# Patient Record
Sex: Female | Born: 1985 | Race: Black or African American | Hispanic: No | Marital: Single | Smoking: Never smoker
Health system: Southern US, Community
[De-identification: ages and names within clinical notes are randomized; demographics above are authoritative.]

---

## 2012-06-20 ENCOUNTER — Encounter (HOSPITAL_COMMUNITY): Payer: Self-pay | Admitting: Emergency Medicine

## 2012-06-20 ENCOUNTER — Emergency Department (HOSPITAL_COMMUNITY)
Admission: EM | Admit: 2012-06-20 | Discharge: 2012-06-21 | Disposition: A | Discharge status: Home / Self Care | Payer: Self-pay | Attending: Emergency Medicine | Admitting: Emergency Medicine

## 2012-06-20 ENCOUNTER — Emergency Department (HOSPITAL_COMMUNITY): Payer: Self-pay

## 2012-06-20 DIAGNOSIS — J45901 Unspecified asthma with (acute) exacerbation: Secondary | ICD-10-CM | POA: Insufficient documentation

## 2012-06-20 DIAGNOSIS — Z79899 Other long term (current) drug therapy: Secondary | ICD-10-CM | POA: Insufficient documentation

## 2012-06-20 MED ORDER — IPRATROPIUM BROMIDE 0.02 % IN SOLN
0.5000 mg | Freq: Once | RESPIRATORY_TRACT | Status: AC
  Administered 2012-06-20: 0.5 mg via RESPIRATORY_TRACT
  Filled 2012-06-20: qty 2.5

## 2012-06-20 MED ORDER — ALBUTEROL SULFATE (5 MG/ML) 0.5% IN NEBU
5.0000 mg | INHALATION_SOLUTION | Freq: Once | RESPIRATORY_TRACT | Status: AC
  Administered 2012-06-20: 5 mg via RESPIRATORY_TRACT
  Filled 2012-06-20: qty 1

## 2012-06-20 MED ORDER — PREDNISONE 20 MG PO TABS
60.0000 mg | ORAL_TABLET | Freq: Once | ORAL | Status: AC
  Administered 2012-06-20: 60 mg via ORAL
  Filled 2012-06-20: qty 3

## 2012-06-20 NOTE — ED Notes (Signed)
Pt states she was laying down tonight and started having difficulty breathing   Pt states she got up and went in the kitchen and sat up for a bit but it continued  Pt states when she was younger she had an inhaler but has not had one in a long time  Pt states this is happening more frequently and she has noticed it happens with weather changes

## 2012-06-21 MED ORDER — PREDNISONE 10 MG PO TABS: 10.0000 mg | ORAL_TABLET | Freq: Every day | ORAL | Status: DC

## 2012-06-21 MED ORDER — ALBUTEROL SULFATE HFA 108 (90 BASE) MCG/ACT IN AERS
2.0000 | INHALATION_SPRAY | RESPIRATORY_TRACT | Status: DC | PRN
  Administered 2012-06-21: 2 via RESPIRATORY_TRACT
  Filled 2012-06-21: qty 6.7

## 2012-06-21 MED ORDER — ALBUTEROL SULFATE HFA 108 (90 BASE) MCG/ACT IN AERS: 1.0000 | INHALATION_SPRAY | Freq: Four times a day (QID) | RESPIRATORY_TRACT | Status: DC | PRN

## 2012-06-21 NOTE — ED Notes (Signed)
PA @ bedside.

## 2012-06-21 NOTE — ED Provider Notes (Signed)
Medical screening examination/treatment/procedure(s) were performed by non-physician practitioner and as supervising physician I was immediately available for consultation/collaboration.  Olivia Mackie, MD 06/21/12 [phone]

## 2012-06-21 NOTE — ED Provider Notes (Signed)
History     CSN: [id number]  Arrival date & time 06/20/12  2307   First MD Initiated Contact with Patient 06/20/12 2333      Chief Complaint  Patient presents with  . Shortness of Breath    (Consider location/radiation/quality/duration/timing/severity/associated sxs/prior treatment) HPI  Pt presents to the ED with complaints of wheezing and SOB. She has a long history of asthma for which she normally uses an albuterol inhaler but has been out of it for years. She noticed that this has been happening more frequently over the past year when the weather turns cold. She endorses having a cough and coughing up some clear mucus. No body aches, nasal congestion, rhinorrhea, shortness of breath, headache, sinus pressure, CP, postnasal drip, neck pain, sore throat, sneezing, chills.  Denies sick contacts at home. She tried drinking coffee and resting but it did not help her symptoms. nad vss  History reviewed. No pertinent past medical history.  History reviewed. No pertinent past surgical history.  Family History  Problem Relation Age of Onset  . Adopted: Yes    History  Substance Use Topics  . Smoking status: Never Smoker   . Smokeless tobacco: Not on file  . Alcohol Use: Yes     Comment: socially    OB History   Grav Para Term Preterm Abortions TAB SAB Ect Mult Living                  Review of Systems  Review of Systems  Gen: no weight loss, fevers, chills, night sweats  Eyes: no discharge or drainage, no occular pain or visual changes  Nose: no epistaxis or rhinorrhea  Mouth: no dental pain, no sore throat  Neck: no neck pain  Lungs:+ wheezing, coughing and SOB NOhemoptysis CV: no chest pain, palpitations, dependent edema or orthopnea  Abd: no abdominal pain, nausea, vomiting  GU: no dysuria or gross hematuria  MSK:  No abnormalities  Neuro: no headache, no focal neurologic deficits  Skin: no abnormalities Psyche: negative.   Allergies  Bee venom  Home  Medications   Current Outpatient Rx  Name  Route  Sig  Dispense  Refill  . albuterol (PROVENTIL HFA;VENTOLIN HFA) 108 (90 BASE) MCG/ACT inhaler   Inhalation   Inhale 1-2 puffs into the lungs every 6 (six) hours as needed for wheezing.   1 Inhaler   0   . predniSONE (DELTASONE) 10 MG tablet   Oral   Take 1 tablet (10 mg total) by mouth daily.   21 tablet   0     6 tabs on day 1, 5 tabs on day 2, 4 tabs on day 3, ...     BP 136/58  Pulse 77  Temp(Src) 98.5 F (36.9 C) (Oral)  Resp 20  SpO2 98%  LMP 05/27/2012  Physical Exam  Nursing note and vitals reviewed. Constitutional: She appears well-developed and well-nourished. No distress.  HENT:  Head: Normocephalic and atraumatic.  Eyes: Pupils are equal, round, and reactive to light.  Neck: Normal range of motion. Neck supple.  Cardiovascular: Normal rate and regular rhythm.   Pulmonary/Chest: Effort normal. No respiratory distress. She has wheezes (diffuse moderate wheezing on exam). She has no rales. She exhibits no tenderness.  Abdominal: Soft.  Neurological: She is alert.  Skin: Skin is warm and dry.    ED Course  Procedures (including critical care time)  Labs Reviewed - No data to display Dg Chest 2 View  06/21/2012  *RADIOLOGY REPORT*  Clinical Data: Wheezing and cough.  Shortness of breath.  History of asthma.  CHEST - 2 VIEW  Comparison: None.  Findings: Mild hyperinflation. The heart size and pulmonary vascularity are normal. The lungs appear clear and expanded without focal air space disease or consolidation. No blunting of the costophrenic angles.  No pneumothorax.  Mediastinal contours appear intact.  IMPRESSION: No evidence of active pulmonary disease.   Original Report Authenticated By: Burman Nieves, M.D.      1. Asthma exacerbation       MDM  Patient given 60 mg by mouth prednisone and an albuterol and Atrovent nebulized treatment. After the treatment she says that she feels much better and no  longer feels short of breath. Her plain film chest x-ray shows no evidence of active pulmonary disease. On re\re auscultation her lungs are no longer hear any wheezing she is moving air very well.  Rx: albuterol and prednisone  Referral: PCP referral upon pt request  Pt has been advised of the symptoms that warrant their return to the ED. Patient has voiced understanding and has agreed to follow-up with the PCP or specialist.         Dorthula Matas, PA 06/21/12 0110

## 2013-01-21 ENCOUNTER — Emergency Department (HOSPITAL_COMMUNITY)
Admission: EM | Admit: 2013-01-21 | Discharge: 2013-01-21 | Disposition: A | Discharge status: Home / Self Care | Payer: 59 | Attending: Emergency Medicine | Admitting: Emergency Medicine

## 2013-01-21 ENCOUNTER — Encounter (HOSPITAL_COMMUNITY): Payer: Self-pay | Admitting: Emergency Medicine

## 2013-01-21 DIAGNOSIS — Z3202 Encounter for pregnancy test, result negative: Secondary | ICD-10-CM | POA: Insufficient documentation

## 2013-01-21 DIAGNOSIS — Z79899 Other long term (current) drug therapy: Secondary | ICD-10-CM | POA: Insufficient documentation

## 2013-01-21 DIAGNOSIS — B349 Viral infection, unspecified: Secondary | ICD-10-CM

## 2013-01-21 DIAGNOSIS — B9789 Other viral agents as the cause of diseases classified elsewhere: Secondary | ICD-10-CM | POA: Insufficient documentation

## 2013-01-21 DIAGNOSIS — J45909 Unspecified asthma, uncomplicated: Secondary | ICD-10-CM | POA: Insufficient documentation

## 2013-01-21 DIAGNOSIS — R5381 Other malaise: Secondary | ICD-10-CM | POA: Insufficient documentation

## 2013-01-21 DIAGNOSIS — Z792 Long term (current) use of antibiotics: Secondary | ICD-10-CM | POA: Insufficient documentation

## 2013-01-21 DIAGNOSIS — R110 Nausea: Secondary | ICD-10-CM | POA: Insufficient documentation

## 2013-01-21 DIAGNOSIS — R51 Headache: Secondary | ICD-10-CM | POA: Insufficient documentation

## 2013-01-21 LAB — CBC WITH DIFFERENTIAL/PLATELET
Basophils Absolute: 0 10*3/uL (ref 0.0–0.1)
Basophils Relative: 0 % (ref 0–1)
Eosinophils Absolute: 0 10*3/uL (ref 0.0–0.7)
Hemoglobin: 13.7 g/dL (ref 12.0–15.0)
MCH: 31.7 pg (ref 26.0–34.0)
MCHC: 34.5 g/dL (ref 30.0–36.0)
Monocytes Relative: 6 % (ref 3–12)
Neutro Abs: 11.3 10*3/uL — ABNORMAL HIGH (ref 1.7–7.7)
Neutrophils Relative %: 82 % — ABNORMAL HIGH (ref 43–77)
Platelets: 189 10*3/uL (ref 150–400)
RDW: 11.7 % (ref 11.5–15.5)

## 2013-01-21 LAB — URINALYSIS, ROUTINE W REFLEX MICROSCOPIC
Glucose, UA: NEGATIVE mg/dL
Hgb urine dipstick: NEGATIVE
Ketones, ur: 15 mg/dL — AB
Leukocytes, UA: NEGATIVE
pH: 5.5 (ref 5.0–8.0)

## 2013-01-21 LAB — COMPREHENSIVE METABOLIC PANEL
AST: 18 U/L (ref 0–37)
Albumin: 3.4 g/dL — ABNORMAL LOW (ref 3.5–5.2)
Alkaline Phosphatase: 62 U/L (ref 39–117)
BUN: 8 mg/dL (ref 6–23)
Potassium: 3.8 mEq/L (ref 3.5–5.1)
Total Protein: 7.3 g/dL (ref 6.0–8.3)

## 2013-01-21 LAB — CG4 I-STAT (LACTIC ACID): Lactic Acid, Venous: 1.03 mmol/L (ref 0.5–2.2)

## 2013-01-21 MED ORDER — SODIUM CHLORIDE 0.9 % IV BOLUS (SEPSIS)
2000.0000 mL | Freq: Once | INTRAVENOUS | Status: AC
  Administered 2013-01-21: 2000 mL via INTRAVENOUS

## 2013-01-21 MED ORDER — ACETAMINOPHEN 325 MG PO TABS
650.0000 mg | ORAL_TABLET | Freq: Four times a day (QID) | ORAL | Status: DC | PRN
  Administered 2013-01-21: 650 mg via ORAL

## 2013-01-21 MED ORDER — ONDANSETRON 8 MG PO TBDP: ORAL_TABLET | ORAL | Status: DC

## 2013-01-21 MED ORDER — SODIUM CHLORIDE 0.9 % IV BOLUS (SEPSIS): 500.0000 mL | Freq: Once | INTRAVENOUS | Status: DC

## 2013-01-21 MED ORDER — OXYCODONE-ACETAMINOPHEN 5-325 MG PO TABS: 2.0000 | ORAL_TABLET | ORAL | Status: DC | PRN

## 2013-01-21 MED ORDER — HYDROMORPHONE HCL PF 1 MG/ML IJ SOLN
1.0000 mg | Freq: Once | INTRAMUSCULAR | Status: AC
  Administered 2013-01-21: 1 mg via INTRAVENOUS
  Filled 2013-01-21: qty 1

## 2013-01-21 MED ORDER — DIPHENHYDRAMINE HCL 50 MG/ML IJ SOLN
25.0000 mg | Freq: Once | INTRAMUSCULAR | Status: AC
  Administered 2013-01-21: 25 mg via INTRAVENOUS
  Filled 2013-01-21: qty 1

## 2013-01-21 MED ORDER — METOCLOPRAMIDE HCL 5 MG/ML IJ SOLN
10.0000 mg | Freq: Once | INTRAMUSCULAR | Status: AC
  Administered 2013-01-21: 10 mg via INTRAVENOUS
  Filled 2013-01-21: qty 2

## 2013-01-21 MED ORDER — METOCLOPRAMIDE HCL 10 MG PO TABS: 10.0000 mg | ORAL_TABLET | Freq: Four times a day (QID) | ORAL | Status: DC | PRN

## 2013-01-21 NOTE — ED Provider Notes (Signed)
CSN: [id number]     Arrival date & time 01/21/13  1440 History   First MD Initiated Contact with Patient 01/21/13 1949     Chief Complaint  Patient presents with  . Fever  . Headache   (Consider location/radiation/quality/duration/timing/severity/associated sxs/prior Treatment) HPI This 27 year old female has a history of asthma, she does not have any recent travel history, she has just over 24 hours of gradual onset fever chills body aches fatigue nausea and last night started out with a very mild headache that became severe but is now improving and is no longer severe today, she has no confusion no stiff neck no rash no change in speech vision swallowing or understanding no focal or lateralizing weakness or numbness no incoordination no confusion, treatment prior to arrival consists of Tylenol and aspirin with partial improvement in symptoms, she is no shortness breath no abdominal pain no vomiting, she did have loose stools this morning improved with Imodium.  History reviewed. No pertinent past medical history. past medical history of asthma History reviewed. No pertinent past surgical history. Family History  Problem Relation Age of Onset  . Adopted: Yes   History  Substance Use Topics  . Smoking status: Never Smoker   . Smokeless tobacco: Not on file  . Alcohol Use: Yes     Comment: socially   OB History   Grav Para Term Preterm Abortions TAB SAB Ect Mult Living                 Review of Systems 10 Systems reviewed and are negative for acute change except as noted in the HPI. Allergies  Bee venom  Home Medications   Current Outpatient Rx  Name  Route  Sig  Dispense  Refill  . albuterol (PROVENTIL HFA;VENTOLIN HFA) 108 (90 BASE) MCG/ACT inhaler   Inhalation   Inhale 2 puffs into the lungs every 6 (six) hours as needed for wheezing or shortness of breath.         Marland Kitchen amoxicillin (AMOXIL) 500 MG capsule   Oral   Take 500 mg by mouth 3 (three) times daily.         Marland Kitchen  aspirin 325 MG tablet   Oral   Take 650 mg by mouth every 4 (four) hours as needed for pain or fever.         . hydrocortisone cream 0.5 %   Topical   Apply 1 application topically daily as needed (eczema).         Marland Kitchen ibuprofen (ADVIL,MOTRIN) 200 MG tablet   Oral   Take 400 mg by mouth every 6 (six) hours as needed for pain.         Marland Kitchen metoCLOPramide (REGLAN) 10 MG tablet   Oral   Take 1 tablet (10 mg total) by mouth every 6 (six) hours as needed (nausea/headache).   6 tablet   0   . ondansetron (ZOFRAN ODT) 8 MG disintegrating tablet      8mg  ODT q4 hours prn nausea   4 tablet   0   . oxyCODONE-acetaminophen (PERCOCET) 5-325 MG per tablet   Oral   Take 2 tablets by mouth every 4 (four) hours as needed for pain.   15 tablet   0    BP 109/63  Pulse 90  Temp(Src) 100.8 F (38.2 C) (Oral)  Resp 16  SpO2 98% Physical Exam  Nursing note and vitals reviewed. Constitutional:  Awake, alert, nontoxic appearance with baseline speech for patient.  HENT:  Head:  Atraumatic.  Mouth/Throat: No oropharyngeal exudate.  Oropharynx noninjected; mucous membranes somewhat dry  Eyes: EOM are normal. Pupils are equal, round, and reactive to light. Right eye exhibits no discharge. Left eye exhibits no discharge.  Neck: Neck supple.  Cardiovascular: Regular rhythm.   No murmur heard. Tachycardic  Pulmonary/Chest: Effort normal and breath sounds normal. No stridor. No respiratory distress. She has no wheezes. She has no rales. She exhibits no tenderness.  Abdominal: Soft. Bowel sounds are normal. She exhibits no mass. There is no tenderness. There is no rebound.  Musculoskeletal: She exhibits tenderness. She exhibits no edema.  Baseline ROM, moves extremities with no obvious new focal weakness. Minimal diffuse tenderness to back arms and legs.  Lymphadenopathy:    She has no cervical adenopathy.  Neurological: She is alert.  Awake, alert, cooperative and aware of situation; motor  strength bilaterally; sensation normal to light touch bilaterally; peripheral visual fields full to confrontation; no facial asymmetry; tongue midline; major cranial nerves appear intact; no pronator drift, normal finger to nose bilaterally, baseline gait without new ataxia.  Skin: No rash noted.  Psychiatric: She has a normal mood and affect.    ED Course  Procedures (including critical care time) Pt feels improved after observation and/or treatment in ED.Patient / Family / Caregiver informed of clinical course, understand medical decision-making process, and agree with plan.Pt aware only LP can r/o meningitis, but I feel Pt unlikely to have bacterial meningitis, do not feel LP necessary and Pt agrees with shared decision-making. Labs Review Labs Reviewed  CBC WITH DIFFERENTIAL - Abnormal; Notable for the following:    WBC 13.8 (*)    Neutrophils Relative % 82 (*)    Neutro Abs 11.3 (*)    All other components within normal limits  COMPREHENSIVE METABOLIC PANEL - Abnormal; Notable for the following:    Albumin 3.4 (*)    GFR calc non Af Amer 77 (*)    GFR calc Af Amer 89 (*)    All other components within normal limits  URINALYSIS, ROUTINE W REFLEX MICROSCOPIC - Abnormal; Notable for the following:    APPearance CLOUDY (*)    Ketones, ur 15 (*)    All other components within normal limits  CG4 I-STAT (LACTIC ACID)  POCT PREGNANCY, URINE   Imaging Review No results found.  MDM   1. Viral syndrome    I doubt any other EMC precluding discharge at this time including, but not necessarily limited to the following:sepsis, bacterial meningitis.    Hurman Horn, MD 01/23/13 [phone]

## 2013-01-21 NOTE — ED Notes (Signed)
Pt c/o fever and body aches with diarrhea and nausea; pt sts started yesterday; pt denies neck pain

## 2013-01-21 NOTE — ED Notes (Signed)
MD at bedside. 

## 2013-05-22 ENCOUNTER — Emergency Department (HOSPITAL_COMMUNITY): Payer: 59

## 2013-05-22 ENCOUNTER — Emergency Department (HOSPITAL_COMMUNITY)
Admission: EM | Admit: 2013-05-22 | Discharge: 2013-05-22 | Disposition: A | Discharge status: Home / Self Care | Payer: 59 | Attending: Emergency Medicine | Admitting: Emergency Medicine

## 2013-05-22 ENCOUNTER — Encounter (HOSPITAL_COMMUNITY): Payer: Self-pay | Admitting: Emergency Medicine

## 2013-05-22 DIAGNOSIS — R42 Dizziness and giddiness: Secondary | ICD-10-CM | POA: Insufficient documentation

## 2013-05-22 DIAGNOSIS — H6120 Impacted cerumen, unspecified ear: Secondary | ICD-10-CM | POA: Insufficient documentation

## 2013-05-22 DIAGNOSIS — R5383 Other fatigue: Secondary | ICD-10-CM

## 2013-05-22 DIAGNOSIS — Z79899 Other long term (current) drug therapy: Secondary | ICD-10-CM | POA: Insufficient documentation

## 2013-05-22 DIAGNOSIS — R52 Pain, unspecified: Secondary | ICD-10-CM | POA: Insufficient documentation

## 2013-05-22 DIAGNOSIS — R0981 Nasal congestion: Secondary | ICD-10-CM

## 2013-05-22 DIAGNOSIS — R509 Fever, unspecified: Secondary | ICD-10-CM | POA: Insufficient documentation

## 2013-05-22 DIAGNOSIS — R5381 Other malaise: Secondary | ICD-10-CM | POA: Insufficient documentation

## 2013-05-22 DIAGNOSIS — R51 Headache: Secondary | ICD-10-CM | POA: Insufficient documentation

## 2013-05-22 DIAGNOSIS — R61 Generalized hyperhidrosis: Secondary | ICD-10-CM | POA: Insufficient documentation

## 2013-05-22 DIAGNOSIS — J3489 Other specified disorders of nose and nasal sinuses: Secondary | ICD-10-CM | POA: Insufficient documentation

## 2013-05-22 DIAGNOSIS — R519 Headache, unspecified: Secondary | ICD-10-CM

## 2013-05-22 DIAGNOSIS — J45909 Unspecified asthma, uncomplicated: Secondary | ICD-10-CM | POA: Insufficient documentation

## 2013-05-22 MED ORDER — ACETAMINOPHEN 500 MG PO TABS: 1000.0000 mg | ORAL_TABLET | Freq: Once | ORAL | Status: DC

## 2013-05-22 MED ORDER — ACETAMINOPHEN 325 MG PO TABS
650.0000 mg | ORAL_TABLET | Freq: Once | ORAL | Status: AC
  Administered 2013-05-22: 650 mg via ORAL
  Filled 2013-05-22: qty 2

## 2013-05-22 MED ORDER — IBUPROFEN 200 MG PO TABS
400.0000 mg | ORAL_TABLET | Freq: Once | ORAL | Status: AC
  Administered 2013-05-22: 400 mg via ORAL
  Filled 2013-05-22: qty 2

## 2013-05-22 NOTE — ED Notes (Signed)
Pt states she feels aches all over and a fever. Pt states she did have shortness of breath and cough on Sunday, which she no longer has.

## 2013-05-22 NOTE — ED Notes (Signed)
Patient transported to X-ray 

## 2013-05-22 NOTE — Discharge Instructions (Signed)
Give 400 mg Ibuprofen (Motrin) every 6-8 hours for fever and pain  °Alternate with Tylenol  °Give 500 mg Tylenol every 4-6 hours as needed for fever and pain  °Follow-up with your primary care provider next week for recheck of symptoms if not improving.  °Be sure to drink plenty of fluids and rest, at least 8hrs of sleep a night, preferably more while you are sick. °Return to the ED if you cannot keep down fluids/signs of dehydration, fever not reducing with Tylenol, difficulty breathing/wheezing, stiff neck, worsening condition, or other concerns (see below)  ° °

## 2013-05-22 NOTE — ED Provider Notes (Signed)
CSN: [id number]     Arrival date & time 05/22/13  1903 History  This chart was scribed for Junius Finner, non-physician practitioner, working with Raeford Razor, MD, by Ellin Mayhew, ED Scribe. This patient was seen in room WTR6/WTR6 and the patient's care was started at 7:57 PM.   Chief Complaint  Patient presents with  . Fever  . Generalized Body Aches   The history is provided by the patient. No language interpreter was used.    HPI Comments: Elizabeth Chang is a 28 y.o. female who presents to the Emergency Department complaining of fever with associated, myalgias that began two days ago. Patient states that three days ago, she had a mild asthma attack, with a cough and SOB, and used a relative's inhaler (who has COPD). These initial symptoms resolved; however, she developed abdominal pain directly following the use of the inhaler. The following day, her abdominal pain resolved; however, they overlapped with a fever of 102.8 degrees (at home), with associated chills, diaphoresis, and myalgias. Patient also states having HA and light headedness which are made worse with movement, sinus pressure. All of her current symptoms began two days ago and have progressively worsened. She states her symptoms are made better by lying down in a comfortable position. Patient confirms a hx of asthma when in middle school and used an inhaler sporadically, as needed every few months. She denies any nausea, vomiting, or diarrhea. She states having had more flatulence recently; however, no other GI/GU symptoms or hx of GU issues. Patient has taken theraflu with no improvement (2 packets within 4 hour time frame, last one at 4:30am). She denies any allergies to medication.  Patient has been around colleagues at work who were recently sick with the flu.  Patient denies having a PCP.  History reviewed. No pertinent past medical history. History reviewed. No pertinent past surgical history. Family History  Problem  Relation Age of Onset  . Adopted: Yes   History  Substance Use Topics  . Smoking status: Never Smoker   . Smokeless tobacco: Not on file  . Alcohol Use: Yes     Comment: socially   OB History   Grav Para Term Preterm Abortions TAB SAB Ect Mult Living                 Review of Systems  Constitutional: Positive for diaphoresis and fatigue.  HENT: Positive for congestion, ear pain, postnasal drip and sinus pressure. Negative for trouble swallowing.   Respiratory: Negative for shortness of breath.   Gastrointestinal: Negative for abdominal pain.  Genitourinary: Negative for frequency, hematuria and difficulty urinating.  Musculoskeletal: Negative for back pain and neck pain.  Neurological: Positive for light-headedness. Negative for weakness and numbness.  All other systems reviewed and are negative.   Allergies  Bee venom  Home Medications   Current Outpatient Rx  Name  Route  Sig  Dispense  Refill  . albuterol (PROVENTIL HFA;VENTOLIN HFA) 108 (90 BASE) MCG/ACT inhaler   Inhalation   Inhale 2 puffs into the lungs every 6 (six) hours as needed for wheezing or shortness of breath.         Marland Kitchen aspirin 325 MG tablet   Oral   Take 650 mg by mouth every 6 (six) hours as needed for moderate pain or fever.          . hydrocortisone cream 0.5 %   Topical   Apply 1 application topically daily as needed (eczema).         Marland Kitchen  ibuprofen (ADVIL,MOTRIN) 200 MG tablet   Oral   Take 400 mg by mouth every 6 (six) hours as needed for pain.         Marland Kitchen. Phenyleph-CPM-DM-APAP (THERAFLU SEVERE COLD NIGHTTIME) 08-24-08-325 MG TABS   Oral   Take 1 tablet by mouth every 4 (four) hours as needed.          Triage Vitals: BP 136/88  Pulse 126  Temp(Src) 102.8 F (39.3 C) (Oral)  Resp 17  SpO2 98%  Physical Exam  Nursing note and vitals reviewed. Constitutional: She is oriented to person, place, and time. She appears well-developed and well-nourished.  HENT:  Head: Normocephalic and  atraumatic.  Right Ear: External ear normal.  Left Ear: External ear normal.  Nose: Mucosal edema present.  L ear cerumen buildup.  Eyes: EOM are normal.  Neck: Normal range of motion.  Cardiovascular: Normal rate, regular rhythm and normal heart sounds.   Pulmonary/Chest: Effort normal and breath sounds normal. No respiratory distress. She has no wheezes.  Musculoskeletal: Normal range of motion.  Neurological: She is alert and oriented to person, place, and time.  Skin: Skin is warm and dry.  Psychiatric: She has a normal mood and affect. Her behavior is normal.    ED Course  Procedures (including critical care time)  DIAGNOSTIC STUDIES: Oxygen Saturation is 98% on room air, normal by my interpretation.    COORDINATION OF CARE: 8:00 PM-Tylenol and ibuprofen given. Discussed likelihood of the flu as a cause of her symptoms. Recommended resting and drinking a lot of fluids. Treatment plan discussed with patient and patient agrees. F/u with wellness center next week if not improving.   Labs Review Labs Reviewed - No data to display Imaging Review Dg Chest 2 View  05/22/2013   CLINICAL DATA:  Shortness of breath and fever  EXAM: CHEST  2 VIEW  COMPARISON:  06/21/2012  FINDINGS: The heart size and mediastinal contours are within normal limits. Both lungs are clear. The visualized skeletal structures are unremarkable.  IMPRESSION: No active cardiopulmonary disease.   Electronically Signed   By: Ruel Favorsrevor  Shick M.D.   On: 05/22/2013 19:51   MDM   1. Fever   2. Headache   3. Body aches   4. Nasal congestion     I personally performed the services described in this documentation, which was scribed in my presence. The recorded information has been reviewed and is accurate.    Junius Finnerrin O'Malley, PA-C 05/22/13 2013

## 2013-05-23 NOTE — ED Provider Notes (Signed)
Medical screening examination/treatment/procedure(s) were performed by non-physician practitioner and as supervising physician I was immediately available for consultation/collaboration.  EKG Interpretation   None        Omaya Nieland, MD 05/23/13 0202 

## 2013-05-25 ENCOUNTER — Encounter (HOSPITAL_COMMUNITY): Payer: Self-pay | Admitting: Emergency Medicine

## 2013-05-25 DIAGNOSIS — R21 Rash and other nonspecific skin eruption: Secondary | ICD-10-CM | POA: Insufficient documentation

## 2013-05-25 DIAGNOSIS — IMO0001 Reserved for inherently not codable concepts without codable children: Secondary | ICD-10-CM | POA: Insufficient documentation

## 2013-05-25 DIAGNOSIS — Z79899 Other long term (current) drug therapy: Secondary | ICD-10-CM | POA: Insufficient documentation

## 2013-05-25 DIAGNOSIS — R509 Fever, unspecified: Secondary | ICD-10-CM | POA: Insufficient documentation

## 2013-05-25 DIAGNOSIS — Z7982 Long term (current) use of aspirin: Secondary | ICD-10-CM | POA: Insufficient documentation

## 2013-05-25 DIAGNOSIS — R112 Nausea with vomiting, unspecified: Secondary | ICD-10-CM | POA: Insufficient documentation

## 2013-05-25 DIAGNOSIS — N390 Urinary tract infection, site not specified: Secondary | ICD-10-CM | POA: Insufficient documentation

## 2013-05-25 NOTE — ED Notes (Signed)
Pt. presents with multiple complaints - headache , body aches , rashes at inner thighs and low grade fever for several days , seen at [facility] ER ( chest x-ray/urinalysis / blood test done)  discharged home diagnosed with Flu / viral illness .

## 2013-05-26 ENCOUNTER — Emergency Department (HOSPITAL_COMMUNITY)
Admission: EM | Admit: 2013-05-26 | Discharge: 2013-05-26 | Disposition: A | Discharge status: Home / Self Care | Payer: 59 | Attending: Emergency Medicine | Admitting: Emergency Medicine

## 2013-05-26 DIAGNOSIS — R1110 Vomiting, unspecified: Secondary | ICD-10-CM

## 2013-05-26 DIAGNOSIS — R509 Fever, unspecified: Secondary | ICD-10-CM

## 2013-05-26 DIAGNOSIS — N390 Urinary tract infection, site not specified: Secondary | ICD-10-CM

## 2013-05-26 LAB — URINE MICROSCOPIC-ADD ON

## 2013-05-26 LAB — COMPREHENSIVE METABOLIC PANEL
ALBUMIN: 3 g/dL — AB (ref 3.5–5.2)
ALT: 121 U/L — AB (ref 0–35)
AST: 51 U/L — ABNORMAL HIGH (ref 0–37)
Alkaline Phosphatase: 179 U/L — ABNORMAL HIGH (ref 39–117)
BUN: 7 mg/dL (ref 6–23)
CALCIUM: 8.7 mg/dL (ref 8.4–10.5)
CO2: 21 mEq/L (ref 19–32)
Chloride: 91 mEq/L — ABNORMAL LOW (ref 96–112)
Creatinine, Ser: 0.82 mg/dL (ref 0.50–1.10)
GFR calc non Af Amer: >90 mL/min (ref >90)
GLUCOSE: 95 mg/dL (ref 70–99)
Potassium: 3.7 mEq/L (ref 3.7–5.3)
Sodium: 145 mEq/L (ref 137–147)
Total Bilirubin: 0.9 mg/dL (ref 0.3–1.2)
Total Protein: 7.9 g/dL (ref 6.0–8.3)

## 2013-05-26 LAB — URINALYSIS, ROUTINE W REFLEX MICROSCOPIC
Glucose, UA: NEGATIVE mg/dL
Ketones, ur: >80 mg/dL — AB
Nitrite: NEGATIVE
PH: 6 (ref 5.0–8.0)
Protein, ur: 100 mg/dL — AB
SPECIFIC GRAVITY, URINE: 1.035 — AB (ref 1.005–1.030)
UROBILINOGEN UA: 1 mg/dL (ref 0.0–1.0)

## 2013-05-26 LAB — CBC WITH DIFFERENTIAL/PLATELET
Basophils Absolute: 0 10*3/uL (ref 0.0–0.1)
Basophils Relative: 0 % (ref 0–1)
EOS ABS: 0.3 10*3/uL (ref 0.0–0.7)
EOS PCT: 2 % (ref 0–5)
HCT: 39.6 % (ref 36.0–46.0)
Hemoglobin: 13.6 g/dL (ref 12.0–15.0)
LYMPHS ABS: 1.6 10*3/uL (ref 0.7–4.0)
Lymphocytes Relative: 11 % — ABNORMAL LOW (ref 12–46)
MCH: 31.5 pg (ref 26.0–34.0)
MCHC: 34.3 g/dL (ref 30.0–36.0)
MCV: 91.7 fL (ref 78.0–100.0)
Monocytes Absolute: 0.9 10*3/uL (ref 0.1–1.0)
Monocytes Relative: 6 % (ref 3–12)
NEUTROS PCT: 81 % — AB (ref 43–77)
Neutro Abs: 12.4 10*3/uL — ABNORMAL HIGH (ref 1.7–7.7)
Platelets: 248 10*3/uL (ref 150–400)
RBC: 4.32 MIL/uL (ref 3.87–5.11)
RDW: 12.5 % (ref 11.5–15.5)
WBC: 15.3 10*3/uL — ABNORMAL HIGH (ref 4.0–10.5)

## 2013-05-26 LAB — CG4 I-STAT (LACTIC ACID): LACTIC ACID, VENOUS: 1.17 mmol/L (ref 0.5–2.2)

## 2013-05-26 LAB — LIPASE, BLOOD: Lipase: 19 U/L (ref 11–59)

## 2013-05-26 MED ORDER — KETOROLAC TROMETHAMINE 30 MG/ML IJ SOLN
30.0000 mg | Freq: Once | INTRAMUSCULAR | Status: AC
  Administered 2013-05-26: 30 mg via INTRAVENOUS
  Filled 2013-05-26: qty 1

## 2013-05-26 MED ORDER — PROMETHAZINE HCL 25 MG PO TABS
25.0000 mg | ORAL_TABLET | Freq: Four times a day (QID) | ORAL | Status: DC | PRN
Start: 2013-05-26 — End: 2014-02-18

## 2013-05-26 MED ORDER — CEPHALEXIN 500 MG PO CAPS: 500.0000 mg | ORAL_CAPSULE | Freq: Four times a day (QID) | ORAL | Status: DC

## 2013-05-26 MED ORDER — ONDANSETRON HCL 4 MG/2ML IJ SOLN
4.0000 mg | Freq: Once | INTRAMUSCULAR | Status: AC
  Administered 2013-05-26: 4 mg via INTRAVENOUS
  Filled 2013-05-26: qty 2

## 2013-05-26 MED ORDER — HYDROCODONE-ACETAMINOPHEN 5-325 MG PO TABS: 1.0000 | ORAL_TABLET | ORAL | Status: DC | PRN

## 2013-05-26 MED ORDER — SODIUM CHLORIDE 0.9 % IV BOLUS (SEPSIS)
1000.0000 mL | Freq: Once | INTRAVENOUS | Status: AC
  Administered 2013-05-26: 1000 mL via INTRAVENOUS

## 2013-05-26 NOTE — ED Notes (Signed)
Pt A&Ox4, ambulatory at discharge with slow, steady gait, verbalizing no complaints at this time.  

## 2013-05-26 NOTE — ED Provider Notes (Signed)
CSN: [id number]     Arrival date & time 05/25/13  2143 History   First MD Initiated Contact with Patient 05/26/13 0023     Chief Complaint  Patient presents with  . Headache   (Consider location/radiation/quality/duration/timing/severity/associated sxs/prior Treatment) HPI Comments: Patient presents to the ER with flu symptoms. Patient was seen in the ER 3 days ago and told she had the flu. Since then she has had persistence of fever, chills, myalgias. Patient reports that she has the headache and also has been having nausea and vomiting. She has not really hold anything down for 3 days. Patient also concerned about the rash on her in her house. She reports that it is nontender, nonpainful, non-itchy.  Patient is a 28 y.o. female presenting with headaches.  Headache Associated symptoms: myalgias, nausea and vomiting   Associated symptoms: no neck stiffness     History reviewed. No pertinent past medical history. History reviewed. No pertinent past surgical history. Family History  Problem Relation Age of Onset  . Adopted: Yes   History  Substance Use Topics  . Smoking status: Never Smoker   . Smokeless tobacco: Not on file  . Alcohol Use: Yes     Comment: socially   OB History   Grav Para Term Preterm Abortions TAB SAB Ect Mult Living                 Review of Systems  Gastrointestinal: Positive for nausea and vomiting.  Musculoskeletal: Positive for myalgias. Negative for neck stiffness.  Skin: Positive for rash.  Neurological: Positive for headaches.  All other systems reviewed and are negative.    Allergies  Bee venom and Other  Home Medications   Current Outpatient Rx  Name  Route  Sig  Dispense  Refill  . albuterol (PROVENTIL HFA;VENTOLIN HFA) 108 (90 BASE) MCG/ACT inhaler   Inhalation   Inhale 2 puffs into the lungs every 6 (six) hours as needed for wheezing or shortness of breath.         Marland Kitchen. aspirin 325 MG tablet   Oral   Take 325 mg by mouth every 6  (six) hours as needed for moderate pain or fever.          Marland Kitchen. ibuprofen (ADVIL,MOTRIN) 200 MG tablet   Oral   Take 400 mg by mouth every 6 (six) hours as needed for pain.         Marland Kitchen. Phenyleph-Diphenhyd-DM-APAP (THERAFLU SEVERE COLD & COUGH) PACKET MISC   Oral   Take 1 packet by mouth every 6 (six) hours as needed (fever).          BP 128/104  Pulse 124  Temp(Src) 97.8 F (36.6 C) (Oral)  Resp 20  Ht 5\' 3"  (1.6 m)  Wt 201 lb (91.173 kg)  BMI 35.61 kg/m2  SpO2 99%  LMP 04/29/2013 Physical Exam  Constitutional: She is oriented to person, place, and time. She appears well-developed and well-nourished. No distress.  HENT:  Head: Normocephalic and atraumatic.  Right Ear: Hearing normal.  Left Ear: Hearing normal.  Nose: Nose normal.  Mouth/Throat: Oropharynx is clear and moist and mucous membranes are normal.  Eyes: Conjunctivae and EOM are normal. Pupils are equal, round, and reactive to light.  Neck: Normal range of motion. Neck supple.  Cardiovascular: Regular rhythm, S1 normal and S2 normal.  Exam reveals no gallop and no friction rub.   No murmur heard. Pulmonary/Chest: Effort normal and breath sounds normal. No respiratory distress. She exhibits no tenderness.  Abdominal: Soft. Normal appearance and bowel sounds are normal. There is no hepatosplenomegaly. There is no tenderness. There is no rebound, no guarding, no tenderness at McBurney's point and negative Murphy's sign. No hernia.  Musculoskeletal: Normal range of motion.  Neurological: She is alert and oriented to person, place, and time. She has normal strength. No cranial nerve deficit or sensory deficit. Coordination normal. GCS eye subscore is 4. GCS verbal subscore is 5. GCS motor subscore is 6.  Skin: Skin is warm, dry and intact. No cyanosis.  Psychiatric: She has a normal mood and affect. Her speech is normal and behavior is normal. Thought content normal.    ED Course  Procedures (including critical care  time) Labs Review Labs Reviewed  CBC WITH DIFFERENTIAL - Abnormal; Notable for the following:    WBC 15.3 (*)    Neutrophils Relative % 81 (*)    Neutro Abs 12.4 (*)    Lymphocytes Relative 11 (*)    All other components within normal limits  URINALYSIS, ROUTINE W REFLEX MICROSCOPIC - Abnormal; Notable for the following:    Color, Urine ORANGE (*)    APPearance CLOUDY (*)    Specific Gravity, Urine 1.035 (*)    Hgb urine dipstick SMALL (*)    Bilirubin Urine MODERATE (*)    Ketones, ur >80 (*)    Protein, ur 100 (*)    Leukocytes, UA SMALL (*)    All other components within normal limits  URINE MICROSCOPIC-ADD ON - Abnormal; Notable for the following:    Squamous Epithelial / LPF MANY (*)    Bacteria, UA MANY (*)    All other components within normal limits  URINE CULTURE  COMPREHENSIVE METABOLIC PANEL  LIPASE, BLOOD  CG4 I-STAT (LACTIC ACID)   Imaging Review No results found.  EKG Interpretation   None       MDM  Diagnosis: Fever, vomiting, possible UTI  Patient presents to the ER for evaluation of intermittent fever, chills, myalgias generalized body aches. She has no evidence of meningitis. She is afebrile currently. Blood work was unremarkable. Urinalysis is equivocal, possible infection. We'll treat empirically. Patient was administered IV fluids here in the ER, Zofran. Will continue antibiotics at home.    Gilda Crease, MD 05/26/13 [phone]

## 2013-05-27 LAB — URINE CULTURE
Colony Count: NO GROWTH
Culture: NO GROWTH

## 2013-05-30 ENCOUNTER — Emergency Department (HOSPITAL_COMMUNITY)
Admission: EM | Admit: 2013-05-30 | Discharge: 2013-05-30 | Disposition: A | Discharge status: Home / Self Care | Payer: 59 | Attending: Emergency Medicine | Admitting: Emergency Medicine

## 2013-05-30 ENCOUNTER — Encounter (HOSPITAL_COMMUNITY): Payer: Self-pay | Admitting: Emergency Medicine

## 2013-05-30 DIAGNOSIS — Z7982 Long term (current) use of aspirin: Secondary | ICD-10-CM | POA: Insufficient documentation

## 2013-05-30 DIAGNOSIS — R5381 Other malaise: Secondary | ICD-10-CM | POA: Insufficient documentation

## 2013-05-30 DIAGNOSIS — R5383 Other fatigue: Secondary | ICD-10-CM

## 2013-05-30 DIAGNOSIS — R509 Fever, unspecified: Secondary | ICD-10-CM | POA: Insufficient documentation

## 2013-05-30 DIAGNOSIS — Z792 Long term (current) use of antibiotics: Secondary | ICD-10-CM | POA: Insufficient documentation

## 2013-05-30 DIAGNOSIS — M436 Torticollis: Secondary | ICD-10-CM | POA: Insufficient documentation

## 2013-05-30 DIAGNOSIS — M791 Myalgia, unspecified site: Secondary | ICD-10-CM

## 2013-05-30 DIAGNOSIS — R1110 Vomiting, unspecified: Secondary | ICD-10-CM | POA: Insufficient documentation

## 2013-05-30 DIAGNOSIS — R51 Headache: Secondary | ICD-10-CM | POA: Insufficient documentation

## 2013-05-30 LAB — URINALYSIS, ROUTINE W REFLEX MICROSCOPIC
Bilirubin Urine: NEGATIVE
Glucose, UA: NEGATIVE mg/dL
Ketones, ur: NEGATIVE mg/dL
Leukocytes, UA: NEGATIVE
Nitrite: NEGATIVE
Protein, ur: NEGATIVE mg/dL
Specific Gravity, Urine: 1.019 (ref 1.005–1.030)
Urobilinogen, UA: 0.2 mg/dL (ref 0.0–1.0)
pH: 8 (ref 5.0–8.0)

## 2013-05-30 LAB — URINE MICROSCOPIC-ADD ON

## 2013-05-30 LAB — BASIC METABOLIC PANEL
BUN: 5 mg/dL — ABNORMAL LOW (ref 6–23)
CO2: 27 mEq/L (ref 19–32)
Calcium: 8.9 mg/dL (ref 8.4–10.5)
Chloride: 97 mEq/L (ref 96–112)
Creatinine, Ser: 0.74 mg/dL (ref 0.50–1.10)
GFR calc Af Amer: >90 mL/min (ref >90)
GFR calc non Af Amer: >90 mL/min (ref >90)
Glucose, Bld: 89 mg/dL (ref 70–99)
Potassium: 3.7 mEq/L (ref 3.7–5.3)
Sodium: 138 mEq/L (ref 137–147)

## 2013-05-30 LAB — CBC WITH DIFFERENTIAL/PLATELET
Basophils Absolute: 0 10*3/uL (ref 0.0–0.1)
Basophils Relative: 0 % (ref 0–1)
Eosinophils Absolute: 0.4 10*3/uL (ref 0.0–0.7)
Eosinophils Relative: 2 % (ref 0–5)
HCT: 36.8 % (ref 36.0–46.0)
Hemoglobin: 12.2 g/dL (ref 12.0–15.0)
Lymphocytes Relative: 10 % — ABNORMAL LOW (ref 12–46)
Lymphs Abs: 2 10*3/uL (ref 0.7–4.0)
MCH: 30.9 pg (ref 26.0–34.0)
MCHC: 33.2 g/dL (ref 30.0–36.0)
MCV: 93.2 fL (ref 78.0–100.0)
Monocytes Absolute: 1.4 10*3/uL — ABNORMAL HIGH (ref 0.1–1.0)
Monocytes Relative: 7 % (ref 3–12)
Neutro Abs: 16.1 10*3/uL — ABNORMAL HIGH (ref 1.7–7.7)
Neutrophils Relative %: 81 % — ABNORMAL HIGH (ref 43–77)
Platelets: 397 10*3/uL (ref 150–400)
RBC: 3.95 MIL/uL (ref 3.87–5.11)
RDW: 13.1 % (ref 11.5–15.5)
WBC: 19.9 10*3/uL — ABNORMAL HIGH (ref 4.0–10.5)

## 2013-05-30 MED ORDER — SODIUM CHLORIDE 0.9 % IV BOLUS (SEPSIS)
1000.0000 mL | Freq: Once | INTRAVENOUS | Status: AC
  Administered 2013-05-30: 1000 mL via INTRAVENOUS

## 2013-05-30 MED ORDER — MORPHINE SULFATE 4 MG/ML IJ SOLN
6.0000 mg | Freq: Once | INTRAMUSCULAR | Status: AC
  Administered 2013-05-30: 6 mg via INTRAVENOUS
  Filled 2013-05-30 (×2): qty 2

## 2013-05-30 MED ORDER — IBUPROFEN 800 MG PO TABS: 800.0000 mg | ORAL_TABLET | Freq: Three times a day (TID) | ORAL | Status: DC | PRN

## 2013-05-30 MED ORDER — DIAZEPAM 5 MG PO TABS: 5.0000 mg | ORAL_TABLET | Freq: Three times a day (TID) | ORAL | Status: DC | PRN

## 2013-05-30 MED ORDER — ONDANSETRON HCL 4 MG/2ML IJ SOLN
4.0000 mg | Freq: Once | INTRAMUSCULAR | Status: AC
  Administered 2013-05-30: 4 mg via INTRAVENOUS
  Filled 2013-05-30: qty 2

## 2013-05-30 MED ORDER — HYDROMORPHONE HCL PF 1 MG/ML IJ SOLN
1.0000 mg | Freq: Once | INTRAMUSCULAR | Status: AC
  Administered 2013-05-30: 1 mg via INTRAVENOUS
  Filled 2013-05-30: qty 1

## 2013-05-30 MED ORDER — DIAZEPAM 5 MG PO TABS
5.0000 mg | ORAL_TABLET | Freq: Once | ORAL | Status: AC
  Administered 2013-05-30: 5 mg via ORAL
  Filled 2013-05-30: qty 1

## 2013-05-30 MED ORDER — OXYCODONE-ACETAMINOPHEN 5-325 MG PO TABS: 1.0000 | ORAL_TABLET | Freq: Four times a day (QID) | ORAL | Status: DC | PRN

## 2013-05-30 MED ORDER — KETOROLAC TROMETHAMINE 30 MG/ML IJ SOLN
30.0000 mg | Freq: Once | INTRAMUSCULAR | Status: AC
  Administered 2013-05-30: 30 mg via INTRAVENOUS
  Filled 2013-05-30: qty 1

## 2013-05-30 NOTE — ED Provider Notes (Signed)
CSN: [id number]     Arrival date & time 05/30/13  1136 History   First MD Initiated Contact with Patient 05/30/13 1155     Chief Complaint  Patient presents with  . Neck Pain   (Consider location/radiation/quality/duration/timing/severity/associated sxs/prior Treatment) HPI 28 yo black female presents with neck pain for 4 days. Patient reports having the flu last week 8 days ago for which she saw her PCP for and was prescribed symptomatic treatment. Additionally she was found to have a UTI and treated with keflex. She stayed in bed for a couple of days until the fever broke 4 days ago. At this time her neck started bothering her and started getting progressively worse with pain ranked 9/10 on the pain scale. The pain is located at the base of her neck and radiates into her shoulders. She has been taking acetaminophen and ibuprofen for the pain. Associated symptoms include headache and vomiting once 5 days ago. Patient denies nausea, diarrhea, chest pain, SOB, and dizziness.   History reviewed. No pertinent past medical history. History reviewed. No pertinent past surgical history. Family History  Problem Relation Age of Onset  . Adopted: Yes   History  Substance Use Topics  . Smoking status: Never Smoker   . Smokeless tobacco: Not on file  . Alcohol Use: Yes     Comment: socially   OB History   Grav Para Term Preterm Abortions TAB SAB Ect Mult Living                 Review of Systems  Constitutional: Positive for fever.  Respiratory: Negative for cough, chest tightness and shortness of breath.   Cardiovascular: Negative.   Gastrointestinal: Negative for nausea, abdominal pain and diarrhea.  Neurological: Positive for numbness and headaches. Negative for dizziness.    Allergies  Bee venom and Other  Home Medications   Current Outpatient Rx  Name  Route  Sig  Dispense  Refill  . acetaminophen (TYLENOL) 500 MG tablet   Oral   Take 500 mg by mouth every 6 (six) hours as  needed for mild pain.         Marland Kitchen albuterol (PROVENTIL HFA;VENTOLIN HFA) 108 (90 BASE) MCG/ACT inhaler   Inhalation   Inhale 2 puffs into the lungs every 6 (six) hours as needed for wheezing or shortness of breath.         Marland Kitchen aspirin 325 MG tablet   Oral   Take 325 mg by mouth every 6 (six) hours as needed for moderate pain or fever.          . cephALEXin (KEFLEX) 500 MG capsule   Oral   Take 1 capsule (500 mg total) by mouth 4 (four) times daily.   28 capsule   0   . HYDROcodone-acetaminophen (NORCO/VICODIN) 5-325 MG per tablet   Oral   Take 1-2 tablets by mouth every 4 (four) hours as needed.   12 tablet   0   . ibuprofen (ADVIL,MOTRIN) 200 MG tablet   Oral   Take 400 mg by mouth every 6 (six) hours as needed for pain.         . promethazine (PHENERGAN) 25 MG tablet   Oral   Take 1 tablet (25 mg total) by mouth every 6 (six) hours as needed for nausea or vomiting.   30 tablet   0   . Phenyleph-Diphenhyd-DM-APAP (THERAFLU SEVERE COLD & COUGH) PACKET MISC   Oral   Take 1 packet by mouth every 6 (six)  hours as needed (fever).          BP 120/79  Pulse 117  Temp(Src) 98.7 F (37.1 C) (Oral)  Resp 20  SpO2 95%  LMP 04/29/2013 Physical Exam  Nursing note and vitals reviewed. Constitutional: She appears well-developed and well-nourished. She is cooperative.  Non-toxic appearance. She does not have a sickly appearance. She appears ill. No distress.  HENT:  Head: Normocephalic and atraumatic. Head is without abrasion.  Mouth/Throat: Oropharynx is clear and moist.  Eyes: Conjunctivae and EOM are normal. Pupils are equal, round, and reactive to light.  Neck: Neck supple. Muscular tenderness present. No tracheal tenderness present. No rigidity. Decreased range of motion present. No Brudzinski's sign and no Kernig's sign noted. No mass and no thyromegaly present.  Cardiovascular: Normal rate, regular rhythm and normal heart sounds.  Exam reveals no gallop and no  friction rub.   No murmur heard. Pulmonary/Chest: Effort normal and breath sounds normal. No stridor. No respiratory distress. She has no decreased breath sounds. She has no wheezes. She has no rales.  Abdominal: She exhibits no abdominal bruit. There is no tenderness.  Neurological: She is alert. She has normal strength. No cranial nerve deficit. She exhibits normal muscle tone. Coordination normal.  Skin: Skin is warm and dry. No erythema.    ED Course  Procedures (including critical care time) Patient does not have any meningeal signs.  She has palpable muscle spasms of the neck and tenderness along the trapezius.  Patient is advised return here as needed.  Told to increase her fluid intake.  Ice and heat to her neck and upper back.  Patient most likely has trapezius strain and irritation.  This is feeling better following IV fluids and pain medication  Carlyle DollyChristopher W Cailyn Houdek, PA-C 05/31/13 1625  Carlyle Dollyhristopher W Emer Onnen, PA-C 05/31/13 1626

## 2013-05-30 NOTE — ED Notes (Signed)
Per EMS, pt from home.  Pt seen twice since 1/28 for fever, generalized pain and UTI.  Pt states today increased neck stiffness and pain which worsens with palpation.  No trauma noted.  Flu-like symptoms have resolved.  Vitals:  128/92, hr 120,

## 2013-05-30 NOTE — ED Notes (Signed)
Bed: WA02 Expected date:  Expected time:  Means of arrival:  Comments: EMS- Neck stiffness

## 2013-05-30 NOTE — Discharge Instructions (Signed)
Use ice and heat on your back.  Return here as needed.  Followup with your primary care Dr. or urgent care

## 2013-06-02 NOTE — ED Provider Notes (Signed)
Medical screening examination/treatment/procedure(s) were conducted as a shared visit with non-physician practitioner(s) and myself.  I personally evaluated the patient during the encounter.  EKG Interpretation   None       Pt with 8 days of flu like sx with resolution of fever appx 4 days pta.  Pt having progressive neck pain and stiffness worse on the right and came for eval.  She denies HA or vision changes.  N/V intact.  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  Appears very uncomfortable however pt displays no signs of menigitis or encephalitis at this time.  Persistent worsening leukocytosis by labs without definitive explanations.  No further UTI sx or vomiting.  Pt given pain control and recommended to f/u with PCP and given strict return prcautions.  Gwyneth SproutWhitney Jakari Sada, MD 06/02/13 [phone]

## 2014-02-10 IMAGING — CR DG CHEST 2V
2 series · 2 of 2 positions shown · non-contrast
Comparison: 06/21/2012

CLINICAL DATA: Shortness of breath and fever

EXAM:
CHEST  2 VIEW

[w chest pa]
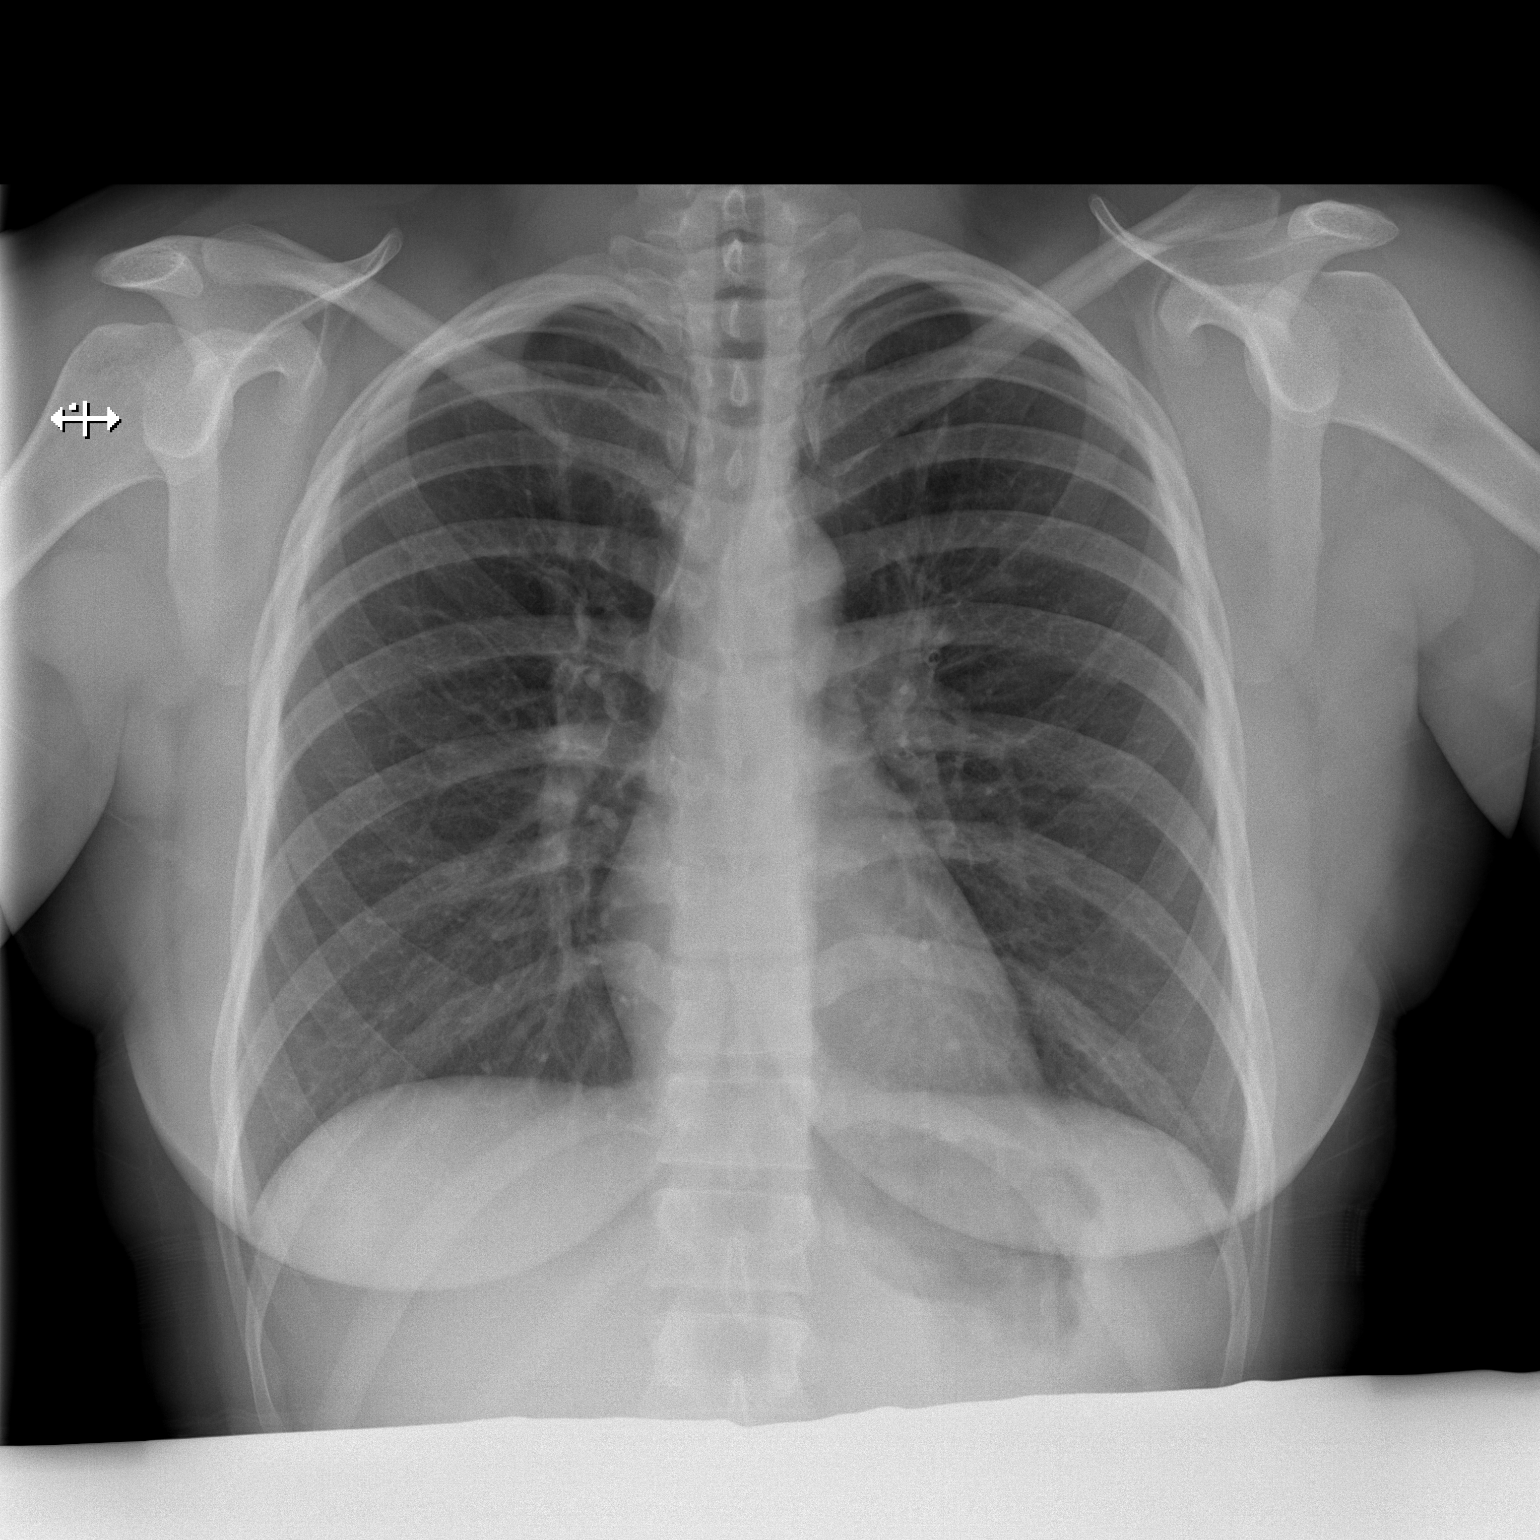

[w chest lat]
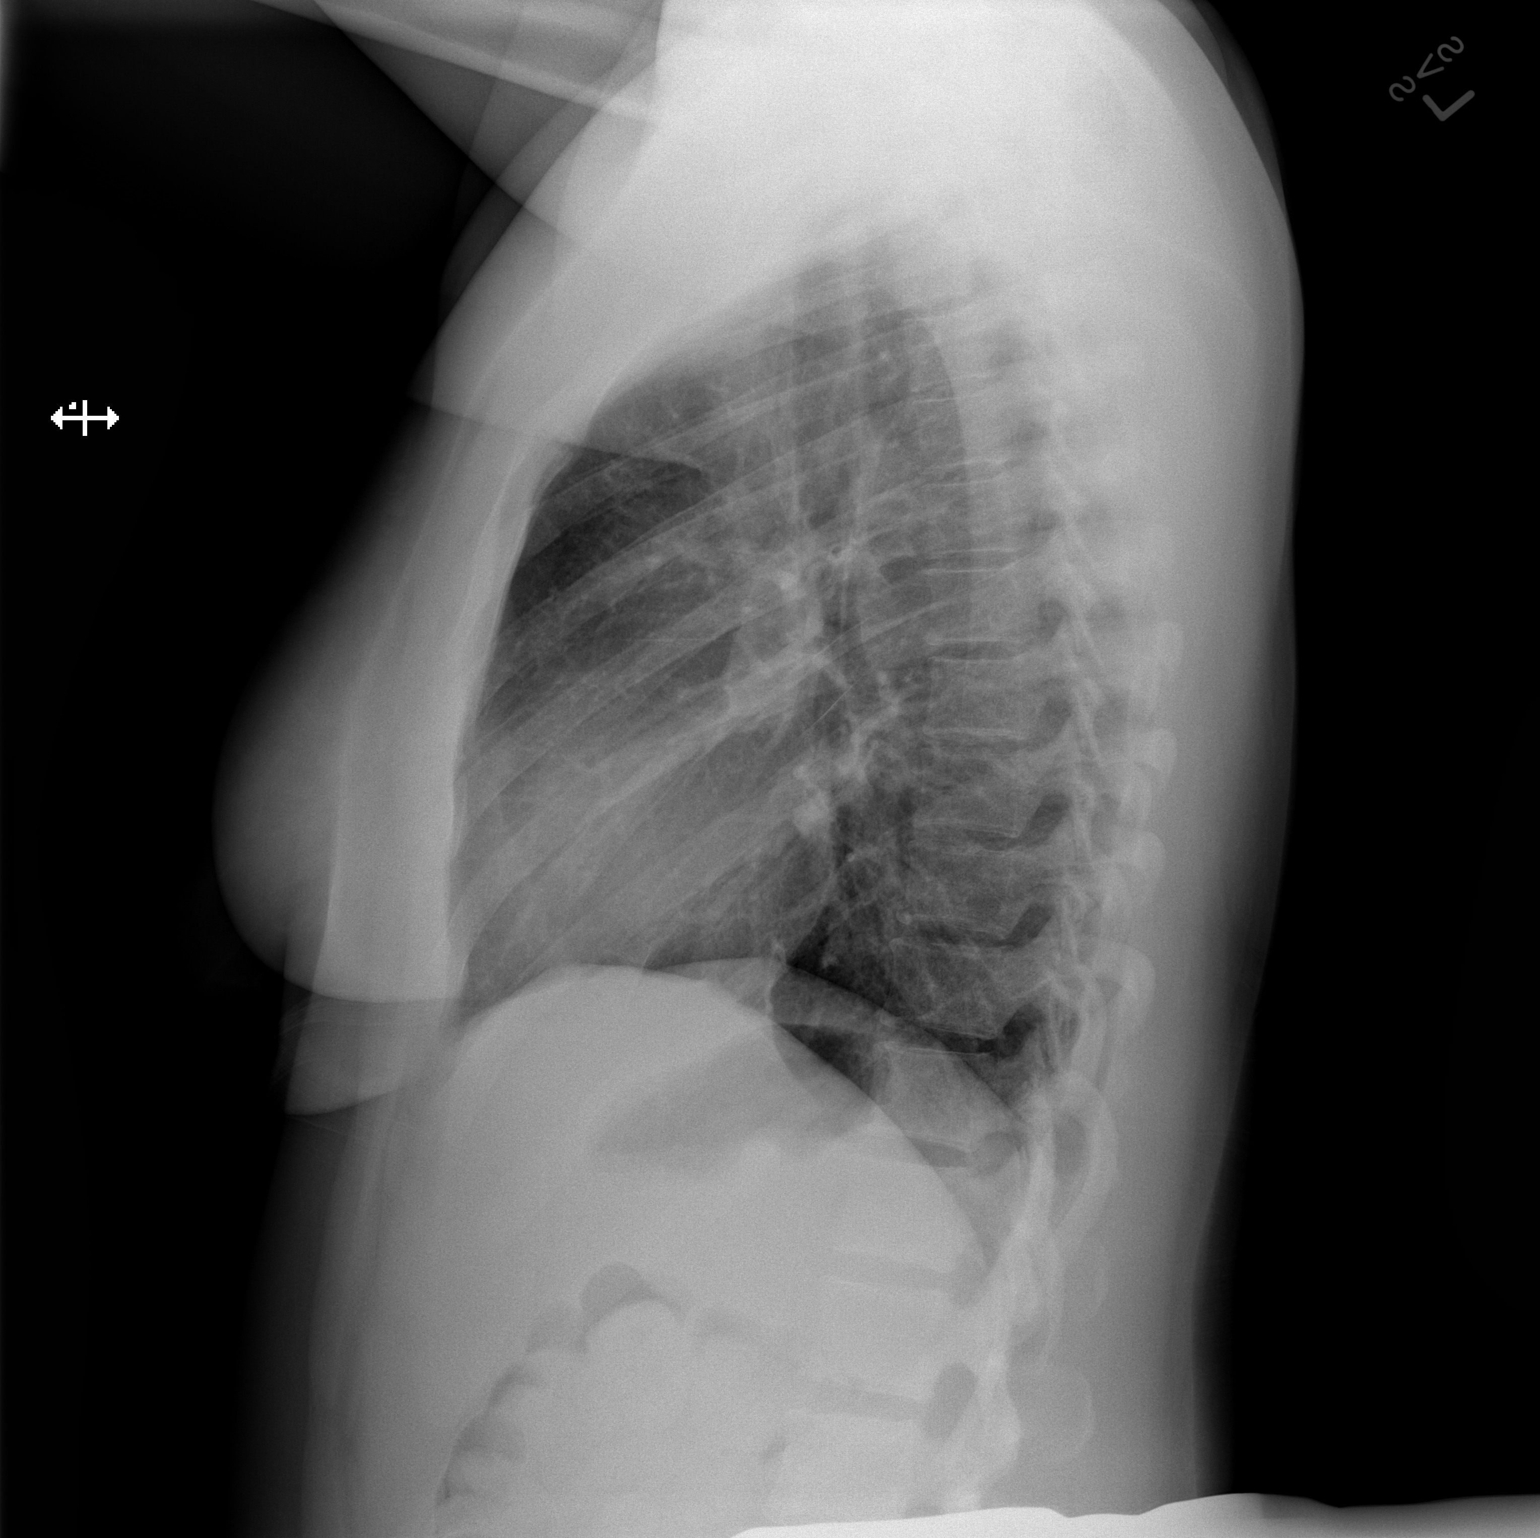

[2 of 2 positions shown; findings below may reference images not displayed]

FINDINGS: The heart size and mediastinal contours are within normal limits.
Both lungs are clear. The visualized skeletal structures are
unremarkable.
IMPRESSION: No active cardiopulmonary disease.

## 2014-02-18 ENCOUNTER — Ambulatory Visit (INDEPENDENT_AMBULATORY_CARE_PROVIDER_SITE_OTHER): Payer: 59 | Admitting: Sports Medicine

## 2014-02-18 VITALS — BP 139/85 | HR 64 | Temp 98.6°F | Resp 24 | Ht 63.5 in | Wt 183.5 lb

## 2014-02-18 DIAGNOSIS — J4521 Mild intermittent asthma with (acute) exacerbation: Secondary | ICD-10-CM

## 2014-02-18 MED ORDER — IPRATROPIUM BROMIDE 0.02 % IN SOLN
0.5000 mg | Freq: Once | RESPIRATORY_TRACT | Status: AC
  Administered 2014-02-18: 0.5 mg via RESPIRATORY_TRACT

## 2014-02-18 MED ORDER — ALBUTEROL SULFATE HFA 108 (90 BASE) MCG/ACT IN AERS: 2.0000 | INHALATION_SPRAY | Freq: Four times a day (QID) | RESPIRATORY_TRACT | Status: AC | PRN

## 2014-02-18 MED ORDER — PREDNISONE 20 MG PO TABS: 60.0000 mg | ORAL_TABLET | Freq: Every day | ORAL | Status: AC

## 2014-02-18 MED ORDER — ALBUTEROL SULFATE (2.5 MG/3ML) 0.083% IN NEBU
2.5000 mg | INHALATION_SOLUTION | Freq: Once | RESPIRATORY_TRACT | Status: AC
  Administered 2014-02-18: 2.5 mg via RESPIRATORY_TRACT

## 2014-02-18 NOTE — Progress Notes (Signed)
Subjective:    Patient ID: Elizabeth Chang, female    DOB: 12/07/1985, 28 y.o.   MRN: [id number]  HPI Elizabeth Chang is a 28 yo female who presents with wheezing. Onset was early this morning, which caused her to wake up. She noticed some associated non-productive cough. She notes off and on bronchospasm as a child, brought on by URI symptoms mainly. She denies any recent URI symptoms. Denies any associated palpitations, non-exertional chest pain, dizziness, or peri-oral numbness. Prior to last night she was in a good state of health without respiratory symptoms. Her likely trigger was turning on the heat last night, which had not been on for a while. She does not know when her furnace filter was ever last changed, introducing likely a high dose of allergens into the air in her home.  Triggers Include: Tobacco: None Pets: Guinnea pig and fish Seasonal Allergies: Environmental trigger includes dust.  Cold viral symptoms: None  Recent Travel: None  Past Hospitalizations/Intubations: None, but was in the ED last year for bronchospasm requiring a couple breathing treatments.  Controller Medications Include: None  Albuterol frequency: Has not had recently due to running out of the medication.  Works at [employer]. History  Substance Use Topics  . Smoking status: Never Smoker   . Smokeless tobacco: Never Used  . Alcohol Use: Yes     Comment: socially   Active Ambulatory Problems    Diagnosis Date Noted  . No Active Ambulatory Problems   Resolved Ambulatory Problems    Diagnosis Date Noted  . No Resolved Ambulatory Problems   No Additional Past Medical History   Allergies  Allergen Reactions  . Bee Venom Swelling  . Other Other (See Comments)    Horses/hay:hives    Review of Systems 11 point review of systems was performed and was otherwise negative unless noted in the history of present illness.     Objective:   Physical Exam BP 139/85  Pulse 64  Temp(Src) 98.6 F (37 C) (Oral)   Resp 24  Ht 5' 3.5" (1.613 m)  Wt 183 lb 8 oz (83.235 kg)  BMI 31.99 kg/m2  SpO2 98% General appearance: alert, cooperative and appears stated age Head: Normocephalic, without obvious abnormality, atraumatic Eyes: conjunctivae/corneas clear. PERRL, EOM's intact. Fundi benign. Ears: normal TM's and external ear canals both ears Nose: Nares normal. Septum midline. Mucosa normal. No drainage or sinus tenderness. Throat: lips, mucosa, and tongue normal; teeth and gums normal Neck: no adenopathy, no carotid bruit, no JVD, supple, symmetrical, trachea midline and thyroid not enlarged, symmetric, no tenderness/mass/nodules Lungs: wheezes base - bilaterally, no respiratory distress, no accessory muscle use Heart: regular rate and rhythm, S1, S2 normal, no murmur, click, rub or gallop Extremities: extremities normal, atraumatic, no cyanosis or edema Pulses: 2+ and symmetric Skin: Skin color, texture, turgor normal. No rashes or lesions Lymph nodes: Cervical, supraclavicular, and axillary nodes normal.  Examination after DuoNeb treatment: Significantly less wheezing, improved aeration. Symptomatic improvement. No respiratory distress. AVSS.      Assessment & Plan:  1. Mild intermittent asthma in acute exacerbation, triggered by environmental allergens (dust). -Improved with Duoneb, AVSS -Rx prednisone 60mg  po qd x 5 days -Rx albuterol inh q6hrs prn -Reasons to RTC discussed or go to ED including more than q4h albuterol use, dizziness, wheezing, SOB, chest pain, or for any other concerns. She verbalized understanding and agreement. -Discussed changing furnace filter as well. -Plan follow-up if symptoms persist or acutely worsen.  Dr. Joellyn HaffPick-Jacobs, DO Sports Medicine Fellow

## 2014-02-18 NOTE — Patient Instructions (Signed)

## 2015-07-23 ENCOUNTER — Encounter (HOSPITAL_COMMUNITY): Payer: Self-pay | Admitting: Emergency Medicine

## 2015-07-23 ENCOUNTER — Emergency Department (HOSPITAL_COMMUNITY)
Admission: EM | Admit: 2015-07-23 | Discharge: 2015-07-23 | Disposition: A | Discharge status: Home / Self Care | Payer: Self-pay | Attending: Emergency Medicine | Admitting: Emergency Medicine

## 2015-07-23 DIAGNOSIS — R300 Dysuria: Secondary | ICD-10-CM | POA: Insufficient documentation

## 2015-07-23 DIAGNOSIS — R52 Pain, unspecified: Secondary | ICD-10-CM

## 2015-07-23 DIAGNOSIS — R34 Anuria and oliguria: Secondary | ICD-10-CM | POA: Insufficient documentation

## 2015-07-23 DIAGNOSIS — Z79899 Other long term (current) drug therapy: Secondary | ICD-10-CM | POA: Insufficient documentation

## 2015-07-23 DIAGNOSIS — R3915 Urgency of urination: Secondary | ICD-10-CM | POA: Insufficient documentation

## 2015-07-23 DIAGNOSIS — R109 Unspecified abdominal pain: Secondary | ICD-10-CM | POA: Insufficient documentation

## 2015-07-23 DIAGNOSIS — Z3202 Encounter for pregnancy test, result negative: Secondary | ICD-10-CM | POA: Insufficient documentation

## 2015-07-23 DIAGNOSIS — R112 Nausea with vomiting, unspecified: Secondary | ICD-10-CM

## 2015-07-23 DIAGNOSIS — R6883 Chills (without fever): Secondary | ICD-10-CM

## 2015-07-23 DIAGNOSIS — R42 Dizziness and giddiness: Secondary | ICD-10-CM | POA: Insufficient documentation

## 2015-07-23 DIAGNOSIS — Z7982 Long term (current) use of aspirin: Secondary | ICD-10-CM | POA: Insufficient documentation

## 2015-07-23 LAB — URINALYSIS, ROUTINE W REFLEX MICROSCOPIC
Glucose, UA: NEGATIVE mg/dL
HGB URINE DIPSTICK: NEGATIVE
Ketones, ur: 15 mg/dL — AB
Leukocytes, UA: NEGATIVE
Nitrite: NEGATIVE
PROTEIN: NEGATIVE mg/dL
Specific Gravity, Urine: 1.031 — ABNORMAL HIGH (ref 1.005–1.030)
pH: 5 (ref 5.0–8.0)

## 2015-07-23 LAB — POC URINE PREG, ED: PREG TEST UR: NEGATIVE

## 2015-07-23 MED ORDER — ONDANSETRON 4 MG PO TBDP
4.0000 mg | ORAL_TABLET | Freq: Once | ORAL | Status: AC
  Administered 2015-07-23: 4 mg via ORAL
  Filled 2015-07-23: qty 1

## 2015-07-23 MED ORDER — IBUPROFEN 800 MG PO TABS: 800.0000 mg | ORAL_TABLET | Freq: Three times a day (TID) | ORAL | Status: AC

## 2015-07-23 MED ORDER — ONDANSETRON 4 MG PO TBDP: 4.0000 mg | ORAL_TABLET | Freq: Three times a day (TID) | ORAL | Status: AC | PRN

## 2015-07-23 MED ORDER — BENZONATATE 100 MG PO CAPS: 100.0000 mg | ORAL_CAPSULE | Freq: Three times a day (TID) | ORAL | Status: AC

## 2015-07-23 NOTE — ED Notes (Signed)
Pt presents to ER from home with sore throat, generalized body aches, chills, vomiting x 8 today; pt denies being around others who are sick; pt also reports she wants to get checked for a UTI- states she is going more frequently and has pressure to urinate but dribbles; pt denies fevers

## 2015-07-23 NOTE — ED Provider Notes (Signed)
CSN: [id number]     Arrival date & time 07/23/15  1943 History  By signing my name below, I, Doreatha Martin, attest that this documentation has been prepared under the direction and in the presence of Andre Gallego, PA-C. Electronically Signed: Doreatha Martin, ED Scribe. 07/23/2015. 8:15 PM.    Chief Complaint  Patient presents with  . Chills  . Generalized Body Aches  . Dysuria   The history is provided by the patient. No language interpreter was used.   HPI Comments: Elizabeth Chang is a 30 y.o. female who presents to the Emergency Department complaining of moderate generalized myalgias onset today with associated nausea, 9 episodes of emesis, lightheadedness, abdominal pain, chills. Pt notes that she has been able to tolerate water. She reports that her lightheadedness and emesis is alleviated with resting. Pt denies taking OTC medications at home to improve symptoms. She also complains of urinary urgency and decreased urine volume today. Patient is currently pain-free. No known sick contacts with similar symptoms. LMP 2 weeks ago. Denies cough, fever, dysuria, hematuria, frequency, diarrhea, neuro deficits, additional complaints.   History reviewed. No pertinent past medical history. History reviewed. No pertinent past surgical history. Family History  Problem Relation Age of Onset  . Adopted: Yes   Social History  Substance Use Topics  . Smoking status: Never Smoker   . Smokeless tobacco: Never Used  . Alcohol Use: Yes     Comment: socially   OB History    No data available     Review of Systems  Constitutional: Positive for chills. Negative for fever.  Respiratory: Negative for cough.   Gastrointestinal: Positive for nausea, vomiting and abdominal pain. Negative for diarrhea.  Genitourinary: Positive for urgency and decreased urine volume. Negative for dysuria, frequency and hematuria.  Musculoskeletal: Positive for myalgias ( generalized).  Neurological: Positive for light-headedness.   All other systems reviewed and are negative.  Allergies  Bee venom and Other  Home Medications   Prior to Admission medications   Medication Sig Start Date End Date Taking? Authorizing Provider  acetaminophen (TYLENOL) 500 MG tablet Take 500 mg by mouth every 6 (six) hours as needed for mild pain.    Historical Provider, MD  albuterol (PROVENTIL HFA;VENTOLIN HFA) 108 (90 BASE) MCG/ACT inhaler Inhale 2 puffs into the lungs every 6 (six) hours as needed for wheezing or shortness of breath. 02/18/14   John C Pick-Jacobs, DO  aspirin 325 MG tablet Take 325 mg by mouth every 6 (six) hours as needed for moderate pain or fever.     Historical Provider, MD  benzonatate (TESSALON) 100 MG capsule Take 1 capsule (100 mg total) by mouth every 8 (eight) hours. 07/23/15   Sansa Alkema C Monserrath Junio, PA-C  ibuprofen (ADVIL,MOTRIN) 800 MG tablet Take 1 tablet (800 mg total) by mouth 3 (three) times daily. 07/23/15   Jariana Shumard C Antrone [place], PA-C  ondansetron (ZOFRAN ODT) 4 MG disintegrating tablet Take 1 tablet (4 mg total) by mouth every 8 (eight) hours as needed for nausea or vomiting. 07/23/15   Lillyann Ahart C Fate Caster, PA-C   BP 116/57 mmHg  Pulse 90  Temp(Src) 98.5 F (36.9 C) (Oral)  Resp 16  SpO2 100% Physical Exam  Constitutional: She is oriented to person, place, and time. She appears well-developed and well-nourished. No distress.  HENT:  Head: Normocephalic and atraumatic.  Eyes: Conjunctivae are normal. Pupils are equal, round, and reactive to light.  Neck: Normal range of motion. Neck supple.  Cardiovascular: Normal rate, regular rhythm, normal  heart sounds and intact distal pulses.  Exam reveals no gallop and no friction rub.   No murmur heard. Pulmonary/Chest: Effort normal and breath sounds normal. No respiratory distress. She has no wheezes. She has no rales.  Abdominal: Soft. There is no tenderness. There is no guarding.  Musculoskeletal: Normal range of motion. She exhibits no edema or tenderness.  Lymphadenopathy:     She has no cervical adenopathy.  Neurological: She is alert and oriented to person, place, and time.  Skin: Skin is warm and dry. She is not diaphoretic.  Psychiatric: She has a normal mood and affect. Her behavior is normal.  Nursing note and vitals reviewed.   ED Course  Procedures (including critical care time) DIAGNOSTIC STUDIES: Oxygen Saturation is 99% on RA, normal by my interpretation.    COORDINATION OF CARE: 8:07 PM Discussed treatment plan with pt at bedside which includes UA and pt agreed to plan.   Labs Review Labs Reviewed  URINALYSIS, ROUTINE W REFLEX MICROSCOPIC (NOT AT [facility]) - Abnormal; Notable for the following:    Color, Urine AMBER (*)    APPearance CLOUDY (*)    Specific Gravity, Urine 1.031 (*)    Bilirubin Urine SMALL (*)    Ketones, ur 15 (*)    All other components within normal limits  POC URINE PREG, ED   I have personally reviewed and evaluated these lab results as part of my medical decision-making.   MDM   Final diagnoses:  Non-intractable vomiting with nausea, vomiting of unspecified type  Body aches  Chills    Elizabeth Chang presents to the ED for evaluation of generalized myalgias, nausea, emesis, abdominal pain and lightheadedness today. UA and urine preg negative. Suspect that the patient has urinary symptoms are due to dehydration. Patient will be sent home with Zofran, ibuprofen, and Tessalon. Conservative therapies discussed and recommended, including tylenol and ibuprofen for any body aches or fever. Patient advised to follow up with PCP as needed, or with worsening symptoms. Patient appears stable for discharge at this time. Return precautions discussed and outlined in discharge paperwork. Patient is agreeable to plan.   Filed Vitals:   07/23/15 1952 07/23/15 2131  BP: 125/67 116/57  Pulse: 96 90  Temp: 98.5 F (36.9 C)   Resp: 16 16     I personally performed the services described in this documentation, which was scribed in my  presence. The recorded information has been reviewed and is accurate.    Anselm PancoastShawn C Nettie Wyffels, PA-C 07/23/15 2151  Pricilla LovelessScott Goldston, MD 07/28/15 1147

## 2015-07-23 NOTE — Discharge Instructions (Signed)
You have been seen today for nausea and vomiting, body aches, and chills. Your lab tests showed no abnormalities. Your symptoms are consistent with a viral illness. Viruses do not require antibiotics. Treatment is symptomatic care. Drink plenty of fluids and get plenty of rest. You should be drinking at least a liter of water an hour to stay hydrated. Ibuprofen or Tylenol for pain or fever. Zofran for nausea. Tessalon for cough. Plain Mucinex may help relieve congestion. Warm liquids or Chloraseptic spray may help soothe the sore throat. Follow up with PCP as needed if symptoms continue. Return to ED should symptoms worsen.  RESOURCE GUIDE  Chronic Pain Problems: Contact [facility]  [phone] Patients need to be referred by their primary care doctor.  Insufficient Money for Medicine: Contact United Way:  call "211" or [facility] [phone]) 509-0154.  No Primary Care Doctor: - Call [facility]  [phone] - can help you locate a primary care doctor that  accepts your insurance, provides certain services, etc. - Physician Referral Service- [phone]  Agencies that provide inexpensive medical care: - [facility]  [phone] - [facility]  [phone] - [facility]  [phone] - Women's Clinic  [phone] - Planned Parenthood  [phone] [facility]  [phone]  Medicaid-accepting [facility] Providers: - [facility]- [address]  [phone], Mon-Fri 9am-7pm, Sat 9am-1pm - [facility]- [address] [place], [address] [place]  [phone] - [facility]- [address] [place]  [phone] [facility]- [address]  [phone] - Renaye Rakers- [address] [place], [address], [phone]  Only accepts [place] patients after they have their name  applied to their card  Self Pay (no insurance) in [place]: - Sickle Cell Patients: Dr Willey Blade, [facility]  [address] [place], [phone] - [facility]- [address] [place]  [phone]       [facility] [place]- [address]       -     [facility]- see information above (Speak to [employer] if you do not have insurance)       -  [facility]- [address] [place], [phone]       -  [facility]- [address],  [phone]       -  [facility]- [address], [phone]       -  Dr Julio Sicks-  [address], [place], [phone]       -  [facility]- [address], [phone]       -  [facility]- [address] [phone], also [address], [phone]       -    [facility]- [address] [place], [phone], 1st & 3rd Saturday   every month, 10am-1pm  1) Find a Doctor and Pay Out of Pocket Although you won't have to find out who is covered by your insurance plan, it is a good idea to ask around and get recommendations. You will then need to call the office and see if the doctor you have chosen will accept you as a new patient and what types of options they offer for patients who are  self-pay. Some doctors offer discounts or will set up payment plans for their patients who do not have insurance, but you will need to ask so you aren't surprised when you get to your appointment.  2) Contact Your Local Health Department Not all health departments have doctors that can see patients for sick visits, but many do, so it is worth a call to see if yours does. If you don't know where your local health department is, you can check in your phone book. The CDC also has a tool to help you locate your state's health department, and many state websites also have listings of all of their local health departments.  3) Find a Walk-in Clinic If your illness is not likely to be very severe or complicated, you  may want to try a walk in clinic. These are popping up all over the country in pharmacies, drugstores, and shopping centers. They're usually staffed by nurse practitioners or physician assistants that have been trained to treat common illnesses and complaints. They're usually fairly quick and inexpensive. However, if you have serious medical issues or chronic medical problems, these are probably not your best option  STD Testing - [facility] [place], STD Clinic, [address], [place], phone [phone] or [phone].  Monday - Friday, call for an appointment. [facility]- [place] [facility] of [facility] [place], STD Clinic, [place], [place], phone [phone] or [phone].  Monday - Friday, call for an appointment.  Abuse/Neglect: [facility]- [place] Child Abuse Hotline 912-300-1893(336) (321)562-7108 [facility]- [place] Child Abuse Hotline 928-398-0605(408) 499-8261 (After Hours)  Emergency Shelter:  [facility] 979-214-1036(336) 203-624-4800  Maternity Homes: - [facility] 2256454686(336) (202) [phone] - [facility] [phone]  MRSA Hotline #:   (772)206-7198304-224-7874  [facility] Resources  [facility] [place]  United Way [facility] [address]                 [address]         [address] [place]  [place]                                               [place]                              [place] Phone:  [phone]                                  Phone:  [phone]                   Phone:  [phone]  [facility], [phone] - [facility] Services - [phone]- [phone]       -     [facility] in [place], [address],                                  [phone], Insurance  [place] Child Abuse Hotline (940) 219-4191(336) 727-629-6123 or (208)007-7883(336) 785-557-2014 (After Hours)   Behavioral Health Services  Substance Abuse  Resources: - Alcohol and  Drug Services  (619) 554-9106 - [facility] 810-872-1840 - [facility] [place] (934)481-8298 Floydene Flock 610-717-7521 - Residential & Outpatient Substance Abuse Program  (351)219-2003  Psychological Services: [facility]  513-249-6672 Services  [phone] - [facility], (651)[phone] [place]. [address] [place], ACCESS LINE: (479)503-9167 or (820) 149-4086, [email or web address]  Dental Assistance  If unable to pay or uninsured, contact:  [facility] or [facility] to become qualified for the adult dental clinic.  Patients with Medicaid: [facility] [phone], [phone] [address], [phone]  If unable to pay, or uninsured, contact [facility] (580) [phone]) or [facility] Department [phone] in [place], [phone] in [facility]) to become qualified for the adult dental clinic   Other Low-Cost Community Dental Services: - [facility]- [address] [place], [place], [place], [address], [phone], [address], 2nd and 4th Thursday of the month at 6:30am.  10 clients each day by appointment, can sometimes see walk-in patients if someone does not show for an appointment. [facility]- [address] [place], [place], [address], [phone] - [facility]- [address] [place], [place], [address], [phone] - [facility]- [phone] [facility] Department- [phone] [facility] Department- [phone]

## 2015-07-23 NOTE — ED Notes (Signed)
Pt reports decrease in nausea and able to keep ginger ale and crackers down; pt denies vomiting
